# Patient Record
Sex: Male | Born: 1975 | Race: White | Hispanic: No | Marital: Single | Smoking: Never smoker
Health system: Southern US, Community
[De-identification: ages and names within clinical notes are randomized; demographics above are authoritative.]

## PROBLEM LIST (undated history)

## (undated) DIAGNOSIS — I10 Essential (primary) hypertension: Secondary | ICD-10-CM

## (undated) HISTORY — PX: APPENDECTOMY: SHX54

---

## 2003-11-04 ENCOUNTER — Encounter: Admission: RE | Admit: 2003-11-04 | Discharge: 2003-11-04 | Payer: Self-pay | Admitting: Occupational Medicine

## 2009-06-08 ENCOUNTER — Ambulatory Visit: Payer: Self-pay | Admitting: General Practice

## 2017-08-17 ENCOUNTER — Other Ambulatory Visit: Payer: Self-pay

## 2017-08-17 ENCOUNTER — Encounter (HOSPITAL_COMMUNITY): Payer: Self-pay | Admitting: Emergency Medicine

## 2017-08-17 ENCOUNTER — Emergency Department (HOSPITAL_COMMUNITY): Payer: Managed Care, Other (non HMO)

## 2017-08-17 ENCOUNTER — Emergency Department (HOSPITAL_COMMUNITY)
Admission: EM | Admit: 2017-08-17 | Discharge: 2017-08-17 | Disposition: A | Discharge status: Home / Self Care | Payer: Managed Care, Other (non HMO) | Attending: Emergency Medicine | Admitting: Emergency Medicine

## 2017-08-17 DIAGNOSIS — M26622 Arthralgia of left temporomandibular joint: Secondary | ICD-10-CM | POA: Insufficient documentation

## 2017-08-17 DIAGNOSIS — R519 Headache, unspecified: Secondary | ICD-10-CM

## 2017-08-17 DIAGNOSIS — R51 Headache: Secondary | ICD-10-CM | POA: Diagnosis not present

## 2017-08-17 MED ORDER — SODIUM CHLORIDE 0.9 % IV BOLUS
1000.0000 mL | Freq: Once | INTRAVENOUS | Status: AC
  Administered 2017-08-17: 1000 mL via INTRAVENOUS

## 2017-08-17 MED ORDER — METOCLOPRAMIDE HCL 5 MG/ML IJ SOLN
10.0000 mg | Freq: Once | INTRAMUSCULAR | Status: AC
  Administered 2017-08-17: 10 mg via INTRAVENOUS
  Filled 2017-08-17: qty 2

## 2017-08-17 MED ORDER — KETOROLAC TROMETHAMINE 30 MG/ML IJ SOLN
30.0000 mg | Freq: Once | INTRAMUSCULAR | Status: AC
  Administered 2017-08-17: 30 mg via INTRAVENOUS
  Filled 2017-08-17: qty 1

## 2017-08-17 MED ORDER — DIPHENHYDRAMINE HCL 50 MG/ML IJ SOLN
25.0000 mg | Freq: Once | INTRAMUSCULAR | Status: AC
  Administered 2017-08-17: 25 mg via INTRAVENOUS
  Filled 2017-08-17: qty 1

## 2017-08-17 NOTE — ED Notes (Signed)
Tele neuro cart placed in pt's room

## 2017-08-17 NOTE — Discharge Instructions (Addendum)
Your evaluated in the emergency department for acute left-sided facial pain.  You had a CAT scan and a neurology consult and no serious causes of your symptoms were identified.  This seems to be most related to your temporomandibular joint.  You should continue to take ibuprofen for inflammation and use a soft diet.  We are giving you the number for an ear nose throat specialist to follow-up with.  Please return if any worsening or new symptoms.

## 2017-08-17 NOTE — ED Triage Notes (Signed)
Patient c/o intermittent severe pain to left side of face and head (behind left eye) that started suddenly around 12pm while trying to take a nap. Patient states sharp shooting pain that is intermittent. Patient states pain makes it difficult for him to close mouth completely. Denies any facial drooping or slurred speech.

## 2017-08-17 NOTE — ED Provider Notes (Signed)
[facility] EMERGENCY DEPARTMENT Provider Note   CSN: [id number] Arrival date & time: 08/17/17  1823     History   Chief Complaint Chief Complaint  Patient presents with  . Facial Pain    HPI Casey Boyer is a 42 y.o. male.  42 year old male with acute onset of left periorbital and left TMJ sharp stabbing pain that occurred around noon today.  Says the pain is intermittent is increased with trying to open his mouth fully.  The pain can be binding and lasts a few seconds.  He is never had this before.  He denies any trauma.  No fevers no chills.  When he gets the pain he says his vision is blurry in that side but otherwise no visual symptoms.  Does not associate with any nasal congestion rhinorrhea cough shortness of breath.  He states he had a few drinks last night and felt a little hung over this morning but is never cause this kind of symptoms before.  No family  history of any trigeminal neuralgia or aneurysms.  He says the pain is making him nauseous.  The history is provided by the patient.  Headache   This is a new problem. The current episode started 6 to 12 hours ago. The problem occurs every few minutes. The problem has not changed since onset.The headache is associated with nothing. The pain is located in the temporal region. The quality of the pain is described as sharp. The pain is severe. The pain radiates to the face. Associated symptoms include nausea. Pertinent negatives include no fever, no chest pressure, no near-syncope, no palpitations, no syncope, no shortness of breath and no vomiting. He has tried NSAIDs for the symptoms. The treatment provided no relief.    History reviewed. No pertinent past medical history.  There are no active problems to display for this patient.   Past Surgical History:  Procedure Laterality Date  . APPENDECTOMY          Home Medications    Prior to Admission medications   Not on File    Family History Family History  Problem  Relation Age of Onset  . Cancer Mother     Social History Social History   Tobacco Use  . Smoking status: Never Smoker  . Smokeless tobacco: Never Used  Substance Use Topics  . Alcohol use: Yes    Comment: occasional  . Drug use: Never     Allergies   Patient has no known allergies.   Review of Systems Review of Systems  Constitutional: Negative for fever.  HENT: Negative for sore throat.   Eyes: Positive for pain.  Respiratory: Negative for shortness of breath.   Cardiovascular: Negative for chest pain, palpitations, syncope and near-syncope.  Gastrointestinal: Positive for nausea. Negative for abdominal pain and vomiting.  Genitourinary: Negative for dysuria.  Musculoskeletal: Negative for back pain and neck pain.  Skin: Negative for rash.  Neurological: Positive for headaches. Negative for facial asymmetry, speech difficulty, weakness and numbness.     Physical Exam Updated Vital Signs BP (!) 186/111 (BP Location: Right Arm)   Pulse 64   Temp 97.8 F (36.6 C) (Oral)   Resp 16   Ht 6' (1.829 m)   Wt 104.3 kg (230 lb)   SpO2 100%   BMI 31.19 kg/m   Physical Exam  Constitutional: He appears well-developed and well-nourished.  HENT:  Head: Normocephalic and atraumatic.  Right Ear: Tympanic membrane and external ear normal.  Left Ear: Tympanic membrane  and external ear normal.  Nose: Nose normal.  Mouth/Throat: Uvula is midline, oropharynx is clear and moist and mucous membranes are normal. No trismus in the jaw. Normal dentition.  He has some tenderness over his left TMJ.  There is no overlying erythema or fluctuance.  Eyes: Conjunctivae are normal.  Neck: Neck supple.  Cardiovascular: Normal rate and regular rhythm.  No murmur heard. Pulmonary/Chest: Effort normal and breath sounds normal. No respiratory distress.  Abdominal: Soft. There is no tenderness.  Musculoskeletal: He exhibits no edema.  Neurological: He is alert.  Skin: Skin is warm and dry.    Psychiatric: He has a normal mood and affect.  Nursing note and vitals reviewed.    ED Treatments / Results  Labs (all labs ordered are listed, but only abnormal results are displayed) Labs Reviewed - No data to display  EKG None  Radiology Ct Head Wo Contrast  Result Date: 08/17/2017 CLINICAL DATA:  Intermittent left headache/facial pain EXAM: CT HEAD WITHOUT CONTRAST CT MAXILLOFACIAL WITHOUT CONTRAST TECHNIQUE: Multidetector CT imaging of the head and maxillofacial structures were performed using the standard protocol without intravenous contrast. Multiplanar CT image reconstructions of the maxillofacial structures were also generated. COMPARISON:  None. FINDINGS: CT HEAD FINDINGS Brain: No evidence of acute infarction, hemorrhage, hydrocephalus, extra-axial collection or mass lesion/mass effect. Vascular: No hyperdense vessel or unexpected calcification. Skull: Normal. Negative for fracture or focal lesion. Other: None. CT MAXILLOFACIAL FINDINGS Osseous: No evidence of maxillofacial fracture. The mandible is intact. The bilateral mandibular condyles are well-seated in the TMJs. Orbits: The bilateral orbits, including the globes and retroconal soft tissues, are within normal limits. Sinuses: The visualized paranasal sinuses are essentially clear. The mastoid air cells are unopacified. Soft tissues: Multiple small bilateral cervical lymph nodes which are not pathologically enlarged. IMPRESSION: Normal head CT. Negative maxillofacial CT. Electronically Signed   By: Charline Bills M.D.   On: 08/17/2017 20:37   Ct Maxillofacial Wo Cm  Result Date: 08/17/2017 CLINICAL DATA:  Intermittent left headache/facial pain EXAM: CT HEAD WITHOUT CONTRAST CT MAXILLOFACIAL WITHOUT CONTRAST TECHNIQUE: Multidetector CT imaging of the head and maxillofacial structures were performed using the standard protocol without intravenous contrast. Multiplanar CT image reconstructions of the maxillofacial structures were  also generated. COMPARISON:  None. FINDINGS: CT HEAD FINDINGS Brain: No evidence of acute infarction, hemorrhage, hydrocephalus, extra-axial collection or mass lesion/mass effect. Vascular: No hyperdense vessel or unexpected calcification. Skull: Normal. Negative for fracture or focal lesion. Other: None. CT MAXILLOFACIAL FINDINGS Osseous: No evidence of maxillofacial fracture. The mandible is intact. The bilateral mandibular condyles are well-seated in the TMJs. Orbits: The bilateral orbits, including the globes and retroconal soft tissues, are within normal limits. Sinuses: The visualized paranasal sinuses are essentially clear. The mastoid air cells are unopacified. Soft tissues: Multiple small bilateral cervical lymph nodes which are not pathologically enlarged. IMPRESSION: Normal head CT. Negative maxillofacial CT. Electronically Signed   By: Charline Bills M.D.   On: 08/17/2017 20:37    Procedures Procedures (including critical care time)  Medications Ordered in ED Medications  sodium chloride 0.9 % bolus 1,000 mL (has no administration in time range)  metoCLOPramide (REGLAN) injection 10 mg (has no administration in time range)  diphenhydrAMINE (BENADRYL) injection 25 mg (has no administration in time range)     Initial Impression / Assessment and Plan / ED Course  I have reviewed the triage vital signs and the nursing notes.  Pertinent labs & imaging results that were available during my care of the  patient were reviewed by me and considered in my medical decision making (see chart for details).  Clinical Course as of Aug 18 1100  Wynelle LinkSun Aug 17, 2017  2107 Discussed with tele-neurology who recommends proceeding with a formal tele-neurology evaluation.  She did ask if I did add on a sed rate and CRP.   [MB]  2232 Tele-neurology has evaluated the patient and they do not feel this is neurologic.  She felt this was more consistent with some TMJ disease.  I gave the patient some Toradol and  he says he has had moderate relief with that.  He is comfortable going home and I will give him ENT number for follow-up.   [MB]    Clinical Course User Index [MB] Terrilee FilesButler, Tonnia Bardin C, MD     Final Clinical Impressions(s) / ED Diagnoses   Final diagnoses:  Facial pain, acute  TMJ tenderness, left    ED Discharge Orders    None       Terrilee FilesButler, Kyilee Gregg C, MD 08/18/17 1102

## 2017-08-17 NOTE — Consult Note (Signed)
    [facility] Tele[facility] Consult Services  Date of service:  08/17/17    Impression:   Left TMJ pain.  This is not of neurologic nature/origin; sxs/exam are not suggestive of migraine, trigeminal neuralgia, temporal arteritis, carotid artery dissection.   Recommendations:  defer to ED to guide further eval/tx for this pain, as this is outside scope of neurology.  Please contact [facility] Nav[facility] to reach me if further questions/concerns arise.  ______________________________________________________________   History of Present Illness: [age]year old man with no signif past med hx presents with left TMJ region for one hour then spread to left tempofrontal region, described as "intense stabbing" worsened with clenching teeth.  There was never any anterior left neck pain or recent neck manipulation/injury.  He notes lessened intensity now but it has not resolved yet.  He noted left eye became blurry only when pain was at it greatest intensity, lasting 1-2 minutes every 15-2520minutes with last occurence one hour ago.  He denies visual field loss, double vision, speech change, focal weakness/numbness, vertigo.  He denies associated nausea, vomiting, photophobia, sonophobia.  STAT consult requested by ED with ? of whether this is trigeminal neuralgia. Exam:  Neurological exam:  awake, alert, pleasant, no acute distress  tenderness to palpation > > left TMJ region worsened with opening/closing mouth, less so left temporal region/orbit  pupils equal round and reactive to light visual field full light touch symmetric bilateral face face strong/symmetric  moves all four ext 3/5 without drift  normal light touch all four ext  normal finger to nose  Imaging:  CT head/maxillofacial:  negative   Medical Data Reviewed:  1.Data reviewed include clinical labs, radiology,  Medical Tests;   2.Tests results discussed with performing or interpreting physician;   3. Reviewed  available prior medical records;  4. Obtained case history from another source if applicable/needed;  5. Independent review of image, tracing.   Medical Decision Making:  - Extensive number of diagnosis or management options are considered above.   - Extensive amount of complex data reviewed.   - High risk of complication and/or morbidity or mortality are associated with differential diagnostic considerations above.  - There may be uncertain outcome and increased probability of prolonged functional impairment or high probability of severe prolonged functional impairment associated with some of these differential diagnosis.    Patient was informed the Neurology Consult would happen via Tele[facility] consult by way of interactive audio and video telecommunications and consented to receiving care in this manner.

## 2017-08-17 NOTE — ED Notes (Signed)
Neurologist is on the camera at bedside at this time.

## 2018-02-18 DIAGNOSIS — I10 Essential (primary) hypertension: Secondary | ICD-10-CM | POA: Diagnosis not present

## 2018-05-18 DIAGNOSIS — B356 Tinea cruris: Secondary | ICD-10-CM | POA: Diagnosis not present

## 2018-05-18 DIAGNOSIS — L237 Allergic contact dermatitis due to plants, except food: Secondary | ICD-10-CM | POA: Diagnosis not present

## 2018-06-17 DIAGNOSIS — I10 Essential (primary) hypertension: Secondary | ICD-10-CM | POA: Diagnosis not present

## 2018-07-08 DIAGNOSIS — K219 Gastro-esophageal reflux disease without esophagitis: Secondary | ICD-10-CM | POA: Diagnosis not present

## 2018-07-08 DIAGNOSIS — Z7189 Other specified counseling: Secondary | ICD-10-CM | POA: Diagnosis not present

## 2018-07-08 DIAGNOSIS — Z0000 Encounter for general adult medical examination without abnormal findings: Secondary | ICD-10-CM | POA: Diagnosis not present

## 2018-07-08 DIAGNOSIS — I10 Essential (primary) hypertension: Secondary | ICD-10-CM | POA: Diagnosis not present

## 2018-07-10 DIAGNOSIS — Z0000 Encounter for general adult medical examination without abnormal findings: Secondary | ICD-10-CM | POA: Diagnosis not present

## 2019-07-05 DIAGNOSIS — Z0000 Encounter for general adult medical examination without abnormal findings: Secondary | ICD-10-CM | POA: Diagnosis not present

## 2019-07-12 ENCOUNTER — Ambulatory Visit: Payer: Managed Care, Other (non HMO) | Admitting: Dermatology

## 2019-08-11 DIAGNOSIS — E785 Hyperlipidemia, unspecified: Secondary | ICD-10-CM | POA: Diagnosis not present

## 2019-08-11 DIAGNOSIS — Z23 Encounter for immunization: Secondary | ICD-10-CM | POA: Diagnosis not present

## 2019-08-11 DIAGNOSIS — I10 Essential (primary) hypertension: Secondary | ICD-10-CM | POA: Diagnosis not present

## 2019-08-11 DIAGNOSIS — Z0000 Encounter for general adult medical examination without abnormal findings: Secondary | ICD-10-CM | POA: Diagnosis not present

## 2019-08-11 DIAGNOSIS — K219 Gastro-esophageal reflux disease without esophagitis: Secondary | ICD-10-CM | POA: Diagnosis not present

## 2019-11-08 DIAGNOSIS — E785 Hyperlipidemia, unspecified: Secondary | ICD-10-CM | POA: Diagnosis not present

## 2019-11-11 DIAGNOSIS — R7309 Other abnormal glucose: Secondary | ICD-10-CM | POA: Diagnosis not present

## 2019-11-11 DIAGNOSIS — R7989 Other specified abnormal findings of blood chemistry: Secondary | ICD-10-CM | POA: Diagnosis not present

## 2019-11-11 DIAGNOSIS — Z125 Encounter for screening for malignant neoplasm of prostate: Secondary | ICD-10-CM | POA: Diagnosis not present

## 2019-11-11 DIAGNOSIS — E785 Hyperlipidemia, unspecified: Secondary | ICD-10-CM | POA: Diagnosis not present

## 2020-02-08 IMAGING — CT CT HEAD W/O CM
3 of 6 series · 16 of 47 positions shown, 19 images · non-contrast
Comparison: None.

CLINICAL DATA: Intermittent left headache/facial pain

EXAM:
CT HEAD WITHOUT CONTRAST
CT MAXILLOFACIAL WITHOUT CONTRAST
TECHNIQUE: Multidetector CT imaging of the head and maxillofacial structures
were performed using the standard protocol without intravenous
contrast. Multiplanar CT image reconstructions of the maxillofacial
structures were also generated.

[Series 4: coronal soft tissue · coronal · 0.34mm/px · 3 of 81 slices shown]
[im 21/81  brain]
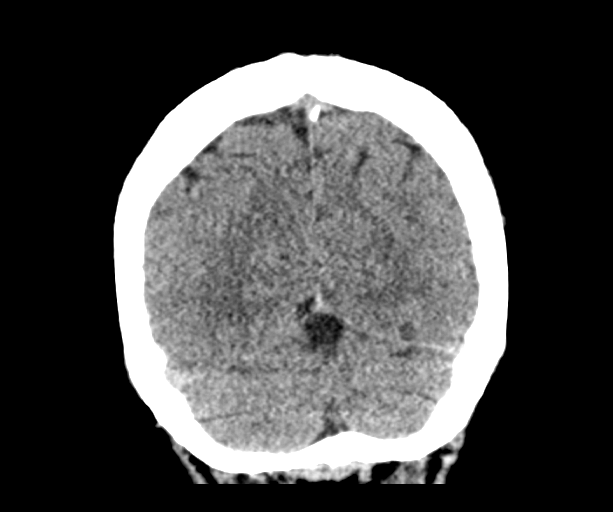
[im 41/81  brain]
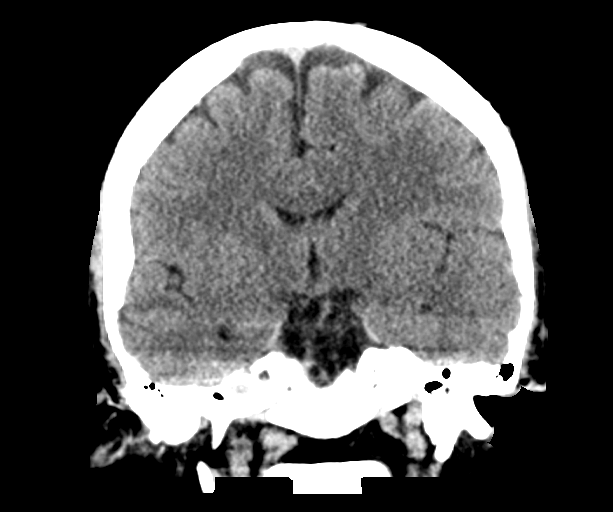
[im 61/81  brain]
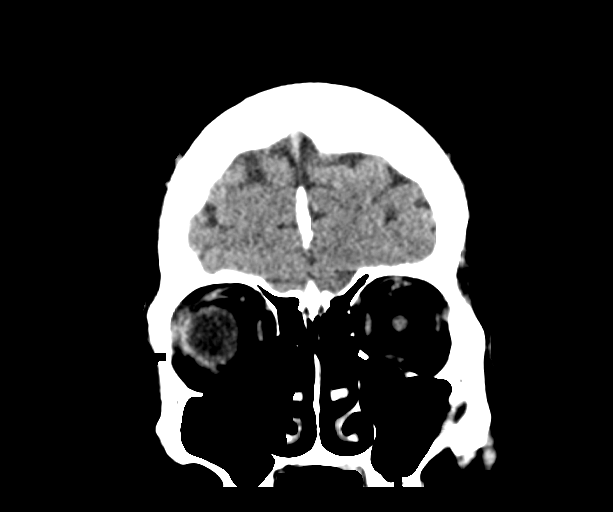

[Series 6: max soft · axial · 0.37mm/px · z∈[+1250,+1438]mm · 11 of 104 slices shown, 14 images]
[im 5/104  brain]
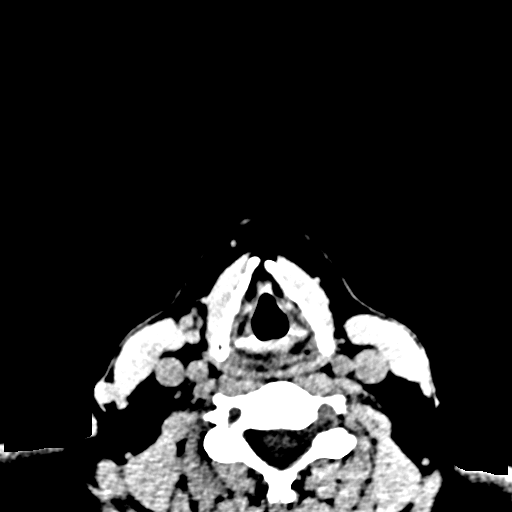
[im 5/104  bone]
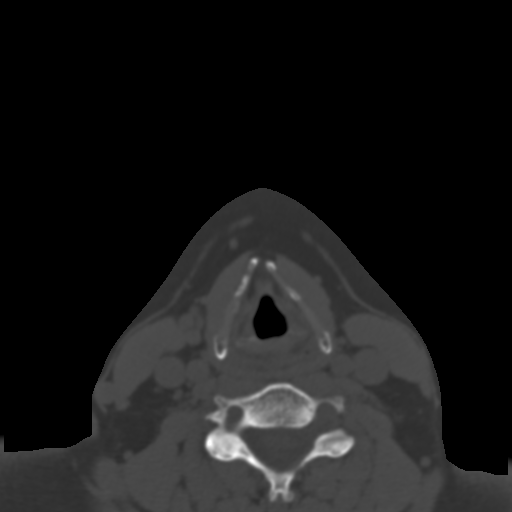
[im 15/104  brain]
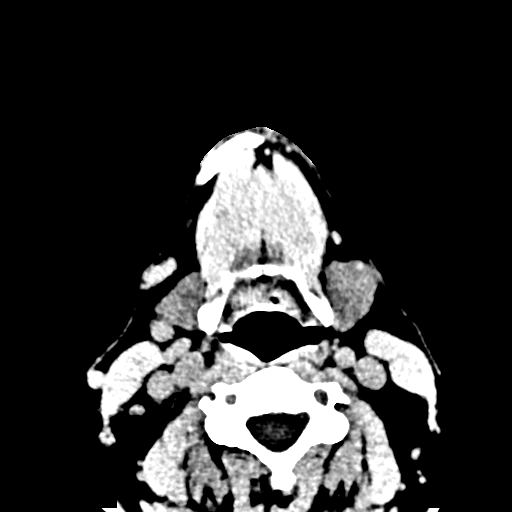
[im 24/104  brain]
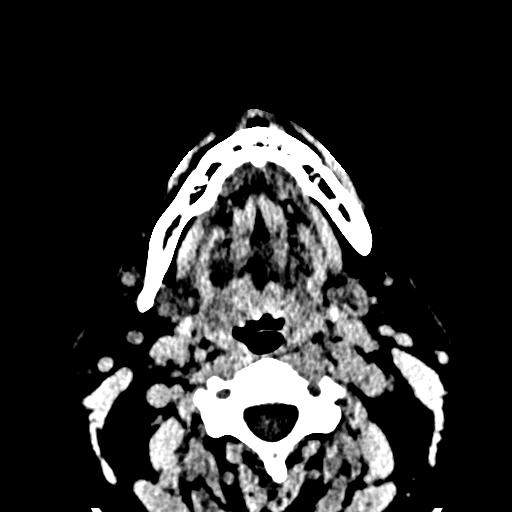
[im 33/104  brain]
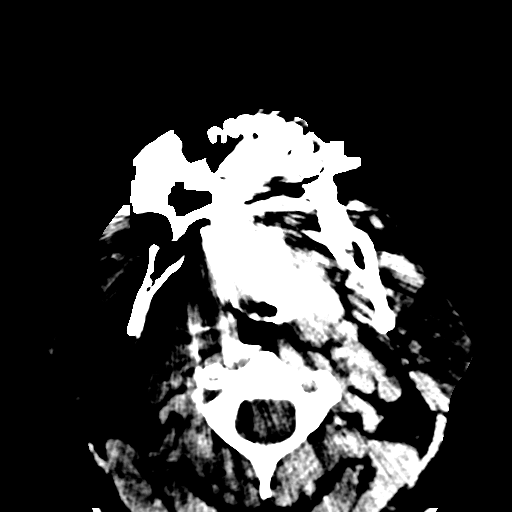
[im 43/104  brain]
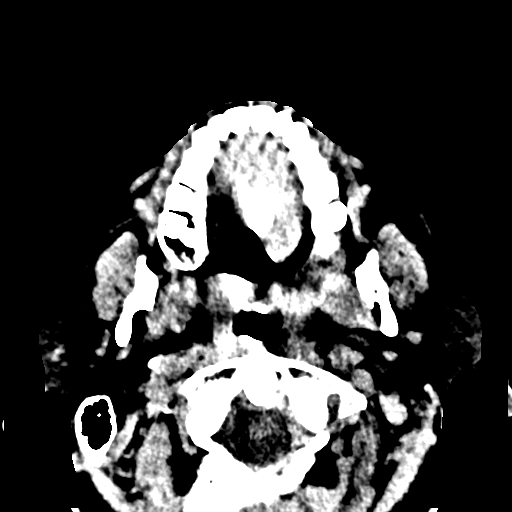
[im 43/104  bone]
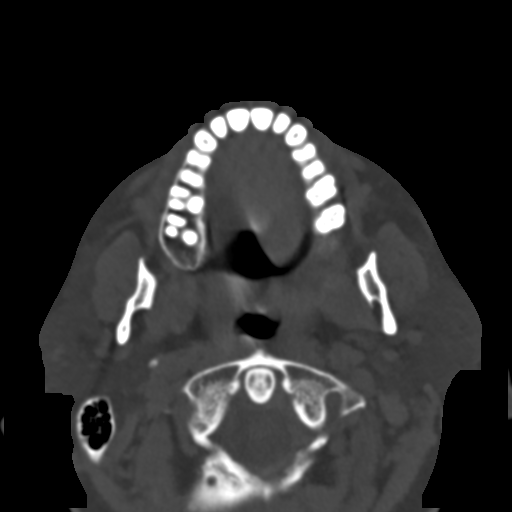
[im 52/104  brain]
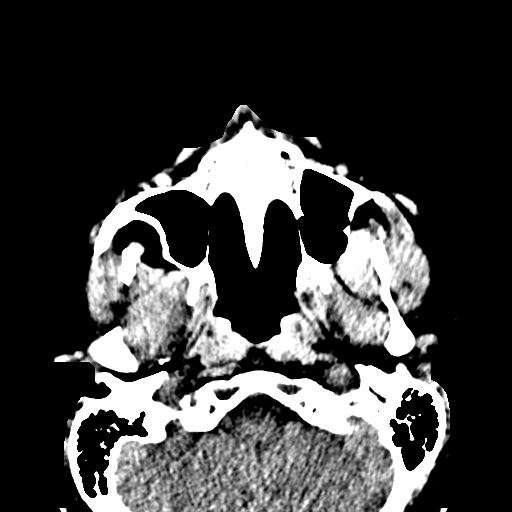
[im 61/104  brain]
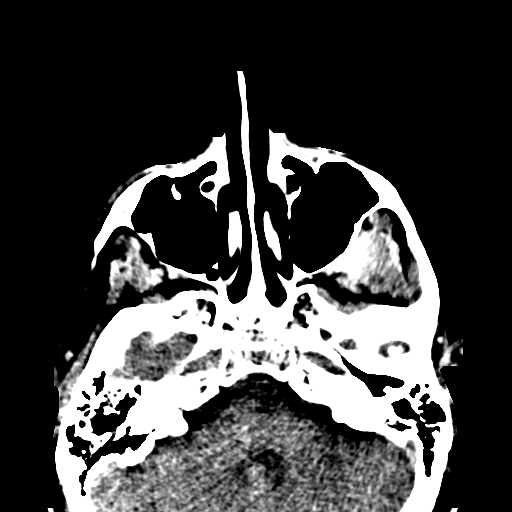
[im 71/104  brain]
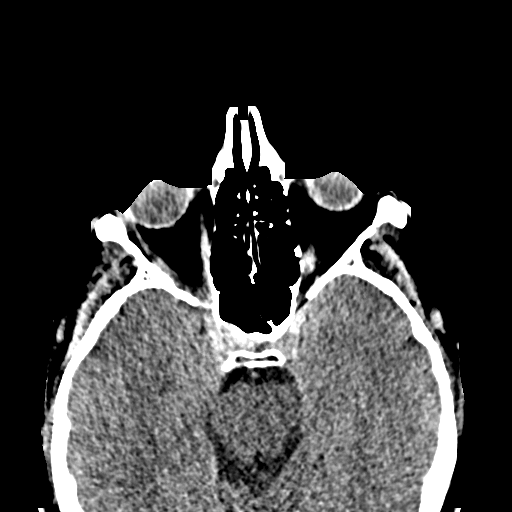
[im 80/104  brain]
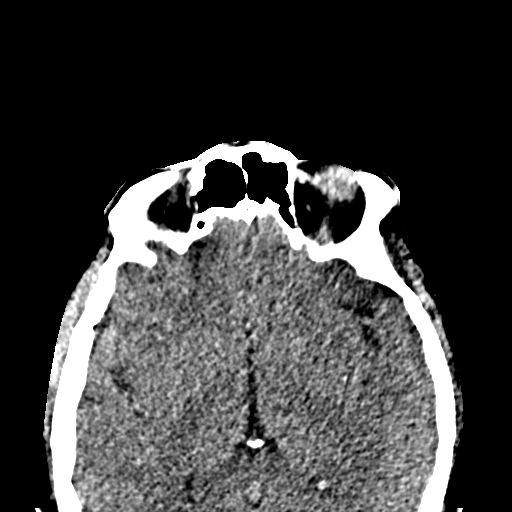
[im 80/104  bone]
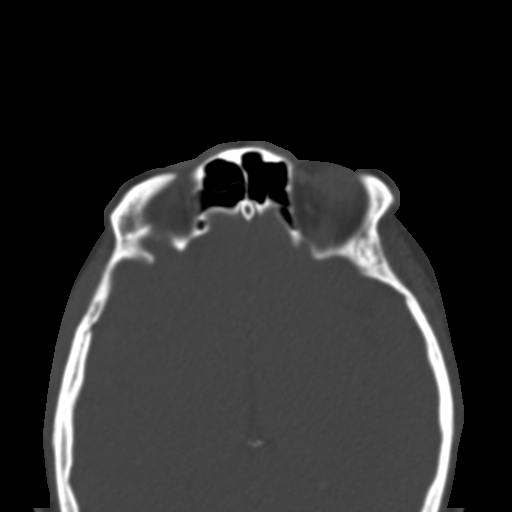
[im 89/104  brain]
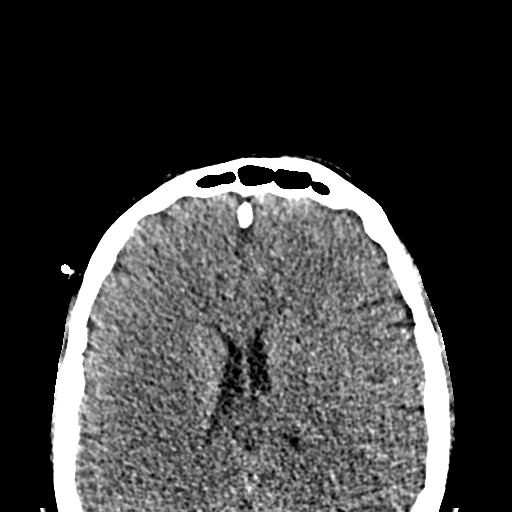
[im 99/104  brain]
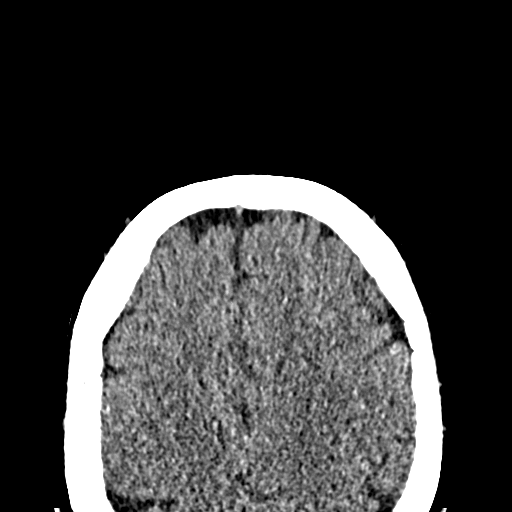

[Series 11: sagittal soft · sagittal · 0.40mm/px · 2 of 103 slices shown]
[im 35/103  brain]
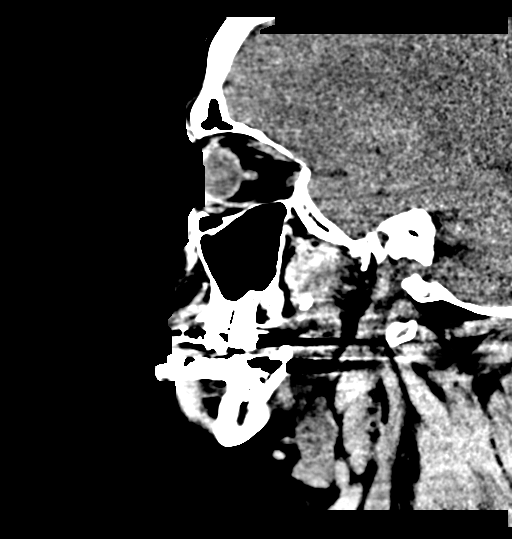
[im 69/103  brain]
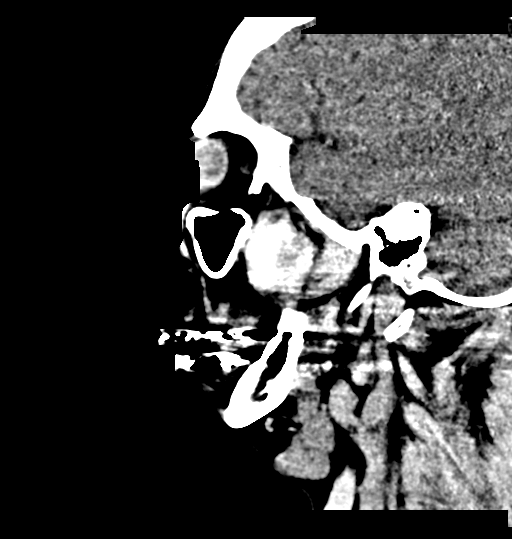

[16 of 47 positions shown; findings below may reference images not displayed]

FINDINGS: CT HEAD FINDINGS

Brain: No evidence of acute infarction, hemorrhage, hydrocephalus,
extra-axial collection or mass lesion/mass effect.

Vascular: No hyperdense vessel or unexpected calcification.

Skull: Normal. Negative for fracture or focal lesion.

Other: None.

CT MAXILLOFACIAL FINDINGS

Osseous: No evidence of maxillofacial fracture.

The mandible is intact. The bilateral mandibular condyles are
well-seated in the TMJs.

Orbits: The bilateral orbits, including the globes and retroconal
soft tissues, are within normal limits.

Sinuses: The visualized paranasal sinuses are essentially clear. The
mastoid air cells are unopacified.

Soft tissues: Multiple small bilateral cervical lymph nodes which
are not pathologically enlarged.
IMPRESSION: Normal head CT.

Negative maxillofacial CT.

## 2020-06-05 DIAGNOSIS — H00014 Hordeolum externum left upper eyelid: Secondary | ICD-10-CM | POA: Diagnosis not present

## 2020-08-01 DIAGNOSIS — E785 Hyperlipidemia, unspecified: Secondary | ICD-10-CM | POA: Diagnosis not present

## 2020-08-01 DIAGNOSIS — I10 Essential (primary) hypertension: Secondary | ICD-10-CM | POA: Diagnosis not present

## 2020-08-01 DIAGNOSIS — Z125 Encounter for screening for malignant neoplasm of prostate: Secondary | ICD-10-CM | POA: Diagnosis not present

## 2020-08-01 DIAGNOSIS — R7989 Other specified abnormal findings of blood chemistry: Secondary | ICD-10-CM | POA: Diagnosis not present

## 2020-08-11 DIAGNOSIS — E785 Hyperlipidemia, unspecified: Secondary | ICD-10-CM | POA: Diagnosis not present

## 2020-08-11 DIAGNOSIS — Z0000 Encounter for general adult medical examination without abnormal findings: Secondary | ICD-10-CM | POA: Diagnosis not present

## 2020-08-11 DIAGNOSIS — K219 Gastro-esophageal reflux disease without esophagitis: Secondary | ICD-10-CM | POA: Diagnosis not present

## 2020-08-11 DIAGNOSIS — I10 Essential (primary) hypertension: Secondary | ICD-10-CM | POA: Diagnosis not present

## 2021-02-06 DIAGNOSIS — E785 Hyperlipidemia, unspecified: Secondary | ICD-10-CM | POA: Diagnosis not present

## 2021-02-06 DIAGNOSIS — I10 Essential (primary) hypertension: Secondary | ICD-10-CM | POA: Diagnosis not present

## 2021-02-06 DIAGNOSIS — R7309 Other abnormal glucose: Secondary | ICD-10-CM | POA: Diagnosis not present

## 2021-02-13 DIAGNOSIS — R748 Abnormal levels of other serum enzymes: Secondary | ICD-10-CM | POA: Diagnosis not present

## 2021-02-13 DIAGNOSIS — E785 Hyperlipidemia, unspecified: Secondary | ICD-10-CM | POA: Diagnosis not present

## 2021-02-13 DIAGNOSIS — I10 Essential (primary) hypertension: Secondary | ICD-10-CM | POA: Diagnosis not present

## 2021-02-13 DIAGNOSIS — K219 Gastro-esophageal reflux disease without esophagitis: Secondary | ICD-10-CM | POA: Diagnosis not present

## 2021-08-14 DIAGNOSIS — I10 Essential (primary) hypertension: Secondary | ICD-10-CM | POA: Diagnosis not present

## 2021-08-14 DIAGNOSIS — R7989 Other specified abnormal findings of blood chemistry: Secondary | ICD-10-CM | POA: Diagnosis not present

## 2021-08-14 DIAGNOSIS — E785 Hyperlipidemia, unspecified: Secondary | ICD-10-CM | POA: Diagnosis not present

## 2021-08-20 DIAGNOSIS — I10 Essential (primary) hypertension: Secondary | ICD-10-CM | POA: Diagnosis not present

## 2021-08-20 DIAGNOSIS — Z0000 Encounter for general adult medical examination without abnormal findings: Secondary | ICD-10-CM | POA: Diagnosis not present

## 2021-08-20 DIAGNOSIS — E785 Hyperlipidemia, unspecified: Secondary | ICD-10-CM | POA: Diagnosis not present

## 2021-08-20 DIAGNOSIS — K219 Gastro-esophageal reflux disease without esophagitis: Secondary | ICD-10-CM | POA: Diagnosis not present

## 2022-02-22 DIAGNOSIS — R7309 Other abnormal glucose: Secondary | ICD-10-CM | POA: Diagnosis not present

## 2022-02-22 DIAGNOSIS — R7989 Other specified abnormal findings of blood chemistry: Secondary | ICD-10-CM | POA: Diagnosis not present

## 2022-02-22 DIAGNOSIS — Z125 Encounter for screening for malignant neoplasm of prostate: Secondary | ICD-10-CM | POA: Diagnosis not present

## 2022-02-22 DIAGNOSIS — I10 Essential (primary) hypertension: Secondary | ICD-10-CM | POA: Diagnosis not present

## 2022-02-22 DIAGNOSIS — E785 Hyperlipidemia, unspecified: Secondary | ICD-10-CM | POA: Diagnosis not present

## 2022-02-22 DIAGNOSIS — K219 Gastro-esophageal reflux disease without esophagitis: Secondary | ICD-10-CM | POA: Diagnosis not present

## 2022-03-04 ENCOUNTER — Other Ambulatory Visit (HOSPITAL_COMMUNITY): Payer: Self-pay | Admitting: Family Medicine

## 2022-03-04 DIAGNOSIS — Z0000 Encounter for general adult medical examination without abnormal findings: Secondary | ICD-10-CM | POA: Diagnosis not present

## 2022-03-04 DIAGNOSIS — E785 Hyperlipidemia, unspecified: Secondary | ICD-10-CM

## 2022-03-04 DIAGNOSIS — L309 Dermatitis, unspecified: Secondary | ICD-10-CM | POA: Diagnosis not present

## 2022-03-04 DIAGNOSIS — R748 Abnormal levels of other serum enzymes: Secondary | ICD-10-CM | POA: Diagnosis not present

## 2022-03-06 DIAGNOSIS — R748 Abnormal levels of other serum enzymes: Secondary | ICD-10-CM | POA: Diagnosis not present

## 2022-03-06 DIAGNOSIS — K8020 Calculus of gallbladder without cholecystitis without obstruction: Secondary | ICD-10-CM | POA: Diagnosis not present

## 2022-03-06 DIAGNOSIS — K760 Fatty (change of) liver, not elsewhere classified: Secondary | ICD-10-CM | POA: Diagnosis not present

## 2022-03-18 DIAGNOSIS — K219 Gastro-esophageal reflux disease without esophagitis: Secondary | ICD-10-CM | POA: Diagnosis not present

## 2022-03-18 DIAGNOSIS — Z1211 Encounter for screening for malignant neoplasm of colon: Secondary | ICD-10-CM | POA: Diagnosis not present

## 2022-03-18 DIAGNOSIS — I10 Essential (primary) hypertension: Secondary | ICD-10-CM | POA: Diagnosis not present

## 2022-03-19 ENCOUNTER — Ambulatory Visit (HOSPITAL_COMMUNITY)
Admission: RE | Admit: 2022-03-19 | Discharge: 2022-03-19 | Disposition: A | Discharge status: Home / Self Care | Payer: Managed Care, Other (non HMO) | Source: Ambulatory Visit | Attending: Family Medicine | Admitting: Family Medicine

## 2022-03-19 DIAGNOSIS — E785 Hyperlipidemia, unspecified: Secondary | ICD-10-CM

## 2022-04-16 DIAGNOSIS — D124 Benign neoplasm of descending colon: Secondary | ICD-10-CM | POA: Diagnosis not present

## 2022-04-16 DIAGNOSIS — Z1211 Encounter for screening for malignant neoplasm of colon: Secondary | ICD-10-CM | POA: Diagnosis not present

## 2022-04-16 DIAGNOSIS — K635 Polyp of colon: Secondary | ICD-10-CM | POA: Diagnosis not present

## 2022-12-08 ENCOUNTER — Ambulatory Visit
Admission: EM | Admit: 2022-12-08 | Discharge: 2022-12-08 | Disposition: A | Discharge status: Home / Self Care | Payer: BC Managed Care – PPO

## 2022-12-08 DIAGNOSIS — R42 Dizziness and giddiness: Secondary | ICD-10-CM

## 2022-12-08 HISTORY — DX: Essential (primary) hypertension: I10

## 2022-12-08 MED ORDER — FLUTICASONE PROPIONATE 50 MCG/ACT NA SUSP: 2.0000 | Freq: Every day | NASAL | 0 refills | Status: AC

## 2022-12-08 MED ORDER — PREDNISONE 20 MG PO TABS: 40.0000 mg | ORAL_TABLET | Freq: Every day | ORAL | 0 refills | Status: AC

## 2022-12-08 MED ORDER — ONDANSETRON 4 MG PO TBDP
4.0000 mg | ORAL_TABLET | Freq: Once | ORAL | Status: AC
  Administered 2022-12-08: 4 mg via ORAL

## 2022-12-08 MED ORDER — ONDANSETRON 4 MG PO TBDP: 4.0000 mg | ORAL_TABLET | Freq: Three times a day (TID) | ORAL | 0 refills | Status: DC | PRN

## 2022-12-08 MED ORDER — CETIRIZINE HCL 10 MG PO TABS: 10.0000 mg | ORAL_TABLET | Freq: Every day | ORAL | 0 refills | Status: DC

## 2022-12-08 NOTE — ED Triage Notes (Signed)
Pt reports he has been feeling dizzy and n/v since this morning     Took OTC meclizine and bp meds but vomited it up.

## 2022-12-08 NOTE — Discharge Instructions (Addendum)
Take medication as prescribed.  Please take your blood pressure medicine as soon as you get home. Increase fluids and allow for plenty of rest. Continue over-the-counter meclizine you are currently taking as needed.  This medication may cause drowsiness.  No driving, operate any heavy machinery, or drinking alcohol while taking the medication. Avoid sudden movement such as twisting, turning, or leaning forward. As discussed, if your symptoms do not improve with the treatment provided today, would like for you to follow-up with your primary care physician for further evaluation. Go to the emergency department immediately if you experience sudden change in vision, loss of vision, loss of consciousness, or fainting. Follow-up as needed.

## 2022-12-08 NOTE — ED Provider Notes (Signed)
[facility]    CSN: [id number] Arrival date & time: 12/08/22  1427      History   Chief Complaint Chief Complaint  Patient presents with   Dizziness    HPI Casey Boyer is a 47 y.o. male.   The history is provided by the patient.   Patient presents for complaints of dizziness and nausea that started this morning upon wakening.  Patient states that he feels like "he is spinning."  He also complains of nausea.  Patient states that he did pick up over-the-counter meclizine, but vomited up the meclizine and his blood pressure medicine when he attempted to take it.  He denies headache, blurred vision, lower extremity weakness, slurred speech, or change in his mental status.  Patient denies prior history of vertigo, but states that he does have constant congestion.  Past Medical History:  Diagnosis Date   Hypertension     There are no problems to display for this patient.   Past Surgical History:  Procedure Laterality Date   APPENDECTOMY         Home Medications    Prior to Admission medications   Medication Sig Start Date End Date Taking? Authorizing Provider  cetirizine (ZYRTEC) 10 MG tablet Take 1 tablet (10 mg total) by mouth daily. 12/08/22  Yes Leath-Warren, Sadie Haber, NP  fluticasone (FLONASE) 50 MCG/ACT nasal spray Place 2 sprays into both nostrils daily. 12/08/22  Yes Leath-Warren, Sadie Haber, NP  Krill Oil 300 MG CAPS Take by mouth.   Yes [provider]  lisinopril (ZESTRIL) 10 MG tablet Take 10 mg by mouth daily.   Yes [provider]  omeprazole (PRILOSEC) 20 MG capsule Take 20 mg by mouth daily.   Yes [provider]  ondansetron (ZOFRAN-ODT) 4 MG disintegrating tablet Take 1 tablet (4 mg total) by mouth every 8 (eight) hours as needed. 12/08/22  Yes Leath-Warren, Sadie Haber, NP  predniSONE (DELTASONE) 20 MG tablet Take 2 tablets (40 mg total) by mouth daily with breakfast for 5 days. 12/08/22 12/13/22 Yes Leath-Warren,  Sadie Haber, NP    Family History History reviewed. No pertinent family history.  Social History Social History   Tobacco Use   Smoking status: Never   Smokeless tobacco: Never  Vaping Use   Vaping status: Never Used  Substance Use Topics   Alcohol use: Yes   Drug use: Never     Allergies   Patient has no known allergies.   Review of Systems Review of Systems Per HPI  Physical Exam Triage Vital Signs ED Triage Vitals  Encounter Vitals Group     BP 12/08/22 1510 (!) 189/96     Systolic BP Percentile --      Diastolic BP Percentile --      Pulse Rate 12/08/22 1510 67     Resp 12/08/22 1510 16     Temp 12/08/22 1510 97.9 F (36.6 C)     Temp Source 12/08/22 1510 Oral     SpO2 12/08/22 1510 97 %     Weight --      Height --      Head Circumference --      Peak Flow --      Pain Score 12/08/22 1512 4     Pain Loc --      Pain Education --      Exclude from Growth Chart --    Orthostatic VS for the past 24 hrs:  BP- Lying Pulse- Lying BP- Sitting Pulse-  Sitting BP- Standing at 0 minutes Pulse- Standing at 0 minutes  12/08/22 1521 (!) 158/98 76 (!) 167/102 83 (!) 167/109 75    Updated Vital Signs BP (!) 189/96 (BP Location: Right Arm)   Pulse 67   Temp 97.9 F (36.6 C) (Oral)   Resp 16   SpO2 97%   Visual Acuity Right Eye Distance:   Left Eye Distance:   Bilateral Distance:    Right Eye Near:   Left Eye Near:    Bilateral Near:     Physical Exam Vitals and nursing note reviewed.  Constitutional:      General: He is not in acute distress.    Appearance: Normal appearance.  HENT:     Head: Normocephalic.     Right Ear: Ear canal and external ear normal. A middle ear effusion is present.     Left Ear: Tympanic membrane, ear canal and external ear normal.     Mouth/Throat:     Mouth: Mucous membranes are moist.     Pharynx: No posterior oropharyngeal erythema.  Eyes:     Extraocular Movements: Extraocular movements intact.      Conjunctiva/sclera: Conjunctivae normal.     Pupils: Pupils are equal, round, and reactive to light.  Cardiovascular:     Rate and Rhythm: Regular rhythm.     Pulses: Normal pulses.     Heart sounds: Normal heart sounds.  Pulmonary:     Effort: Pulmonary effort is normal.     Breath sounds: Normal breath sounds.  Abdominal:     General: Bowel sounds are normal.     Palpations: Abdomen is soft.     Tenderness: There is no abdominal tenderness.  Musculoskeletal:     Cervical back: Normal range of motion.  Skin:    General: Skin is warm and dry.  Neurological:     General: No focal deficit present.     Mental Status: He is alert and oriented to person, place, and time.  Psychiatric:        Mood and Affect: Mood normal.        Behavior: Behavior normal.      UC Treatments / Results  Labs (all labs ordered are listed, but only abnormal results are displayed) Labs Reviewed - No data to display  EKG   Radiology No results found.  Procedures Procedures (including critical care time)  Medications Ordered in UC Medications  ondansetron (ZOFRAN-ODT) disintegrating tablet 4 mg (4 mg Oral Given 12/08/22 1526)    Initial Impression / Assessment and Plan / UC Course  I have reviewed the triage vital signs and the nursing notes.  Pertinent labs & imaging results that were available during my care of the patient were reviewed by me and considered in my medical decision making (see chart for details).  Orthostatic vital signs were negative.  Patient is currently hypertensive, reports that he did vomit his medicine after he took it due to the nausea.  Patient was given ondansetron 4 mg ODT, reports good relief of nausea.  He has been taking over-the-counter meclizine with also some relief of his symptoms.  Will start patient on fluticasone 50 mcg nasal spray and cetirizine 10 mg for eustachian tube dysfunction.  Patient was advised if symptoms do not improve with this treatment, a  prescription for prednisone 40 mg has also been prescribed.  Zofran 4 mg ODT has also been prescribed for patient's nausea.  Supportive care recommendations were provided and discussed with the patient to include  fluids, rest, and avoiding sudden movement.  Patient was given strict ER follow-up precautions, along with indications to follow-up with his PCP.  Patient is in agreement with this plan of care and verbalizes understanding.  All questions were answered.  Patient stable for discharge.  Final Clinical Impressions(s) / UC Diagnoses   Final diagnoses:  Vertigo     Discharge Instructions      Take medication as prescribed.  Please take your blood pressure medicine as soon as you get home. Increase fluids and allow for plenty of rest. Continue over-the-counter meclizine you are currently taking as needed.  This medication may cause drowsiness.  No driving, operate any heavy machinery, or drinking alcohol while taking the medication. Avoid sudden movement such as twisting, turning, or leaning forward. As discussed, if your symptoms do not improve with the treatment provided today, would like for you to follow-up with your primary care physician for further evaluation. Go to the emergency department immediately if you experience sudden change in vision, loss of vision, loss of consciousness, or fainting. Follow-up as needed.     ED Prescriptions     Medication Sig Dispense Auth. Provider   fluticasone (FLONASE) 50 MCG/ACT nasal spray Place 2 sprays into both nostrils daily. 16 g Leath-Warren, Sadie Haber, NP   cetirizine (ZYRTEC) 10 MG tablet Take 1 tablet (10 mg total) by mouth daily. 30 tablet Leath-Warren, Sadie Haber, NP   predniSONE (DELTASONE) 20 MG tablet Take 2 tablets (40 mg total) by mouth daily with breakfast for 5 days. 10 tablet Leath-Warren, Sadie Haber, NP   ondansetron (ZOFRAN-ODT) 4 MG disintegrating tablet Take 1 tablet (4 mg total) by mouth every 8 (eight) hours as  needed. 20 tablet Leath-Warren, Sadie Haber, NP      PDMP not reviewed this encounter.   Abran Cantor, NP 12/08/22 1540

## 2022-12-09 ENCOUNTER — Encounter (HOSPITAL_COMMUNITY): Payer: Self-pay | Admitting: Emergency Medicine

## 2023-11-17 ENCOUNTER — Encounter: Payer: Self-pay | Admitting: Dermatology

## 2023-11-17 ENCOUNTER — Ambulatory Visit: Admitting: Dermatology

## 2023-11-17 VITALS — BP 158/102

## 2023-11-17 DIAGNOSIS — L821 Other seborrheic keratosis: Secondary | ICD-10-CM | POA: Diagnosis not present

## 2023-11-17 DIAGNOSIS — D2239 Melanocytic nevi of other parts of face: Secondary | ICD-10-CM | POA: Diagnosis not present

## 2023-11-17 DIAGNOSIS — W908XXA Exposure to other nonionizing radiation, initial encounter: Secondary | ICD-10-CM | POA: Diagnosis not present

## 2023-11-17 DIAGNOSIS — D225 Melanocytic nevi of trunk: Secondary | ICD-10-CM

## 2023-11-17 DIAGNOSIS — D485 Neoplasm of uncertain behavior of skin: Secondary | ICD-10-CM

## 2023-11-17 DIAGNOSIS — L578 Other skin changes due to chronic exposure to nonionizing radiation: Secondary | ICD-10-CM | POA: Diagnosis not present

## 2023-11-17 DIAGNOSIS — D229 Melanocytic nevi, unspecified: Secondary | ICD-10-CM

## 2023-11-17 DIAGNOSIS — D2371 Other benign neoplasm of skin of right lower limb, including hip: Secondary | ICD-10-CM | POA: Diagnosis not present

## 2023-11-17 DIAGNOSIS — L814 Other melanin hyperpigmentation: Secondary | ICD-10-CM

## 2023-11-17 DIAGNOSIS — Z1283 Encounter for screening for malignant neoplasm of skin: Secondary | ICD-10-CM | POA: Diagnosis not present

## 2023-11-17 DIAGNOSIS — D1801 Hemangioma of skin and subcutaneous tissue: Secondary | ICD-10-CM

## 2023-11-17 NOTE — Progress Notes (Signed)
 New Patient Visit   Subjective  Casey Boyer is a 48 y.o. male NEW PATIENT who presents for the following:   Total Body Skin Exam (TBSE)  The patient reports he has spots (eyebrow), moles and lesions to be evaluated, some may be new or changing and the patient may have concern these could be cancer. Patient has previously been treated by a dermatologist at [facility]. Hx of Bx - benign. No family Hx of skin cancers. Patient does apply sunscreen and/or wears protective coverings.  The following portions of the chart were reviewed this encounter and updated as appropriate: medications, allergies, medical history  Review of Systems:  No other skin or systemic complaints except as noted in HPI or Assessment and Plan.  Objective  Well appearing patient in no apparent distress; mood and affect are within normal limits.  A full examination was performed including scalp, head, eyes, ears, nose, lips, neck, chest, axillae, abdomen, back, buttocks, bilateral upper extremities, bilateral lower extremities, hands, feet, fingers, toes, fingernails, and toenails. All findings within normal limits unless otherwise noted below.    Relevant exam findings are noted in the Assessment and Plan.       Right Abdomen (side) - Upper 5mm irregular macule   Assessment & Plan   LENTIGINES, SEBORRHEIC KERATOSES, HEMANGIOMAS - Benign normal skin lesions - Benign-appearing - Call for any changes  BENIGN MELANOCYTIC NEVI --8mm flesh colored papule on R eyebrow referring to plastics due to recurrence post-shave removal - Tan-brown and/or pink-flesh-colored symmetric macules and papules - Benign appearing on exam today - Observation - Call clinic for new or changing moles - Recommend daily use of broad spectrum spf 30+ sunscreen to sun-exposed areas.   MILD ACTINIC DAMAGE - Chronic condition, secondary to cumulative UV/sun exposure - diffuse scaly erythematous macules with underlying  dyspigmentation - Recommend daily broad spectrum sunscreen SPF 30+ to sun-exposed areas, reapply every 2 hours as needed.  - Staying in the shade or wearing long sleeves, sun glasses (UVA+UVB protection) and wide brim hats (4-inch brim around the entire circumference of the hat) are also recommended for sun protection.  - Call for new or changing lesions.  DERMATOFIBROMA Exam: Firm pink/brown papulenodule with dimple sign at R interior thigh  Treatment Plan: A dermatofibroma is a benign growth possibly related to trauma, such as an insect bite, cut from shaving, or inflamed acne-type bump.  Treatment options to remove include shave or excision with resulting scar and risk of recurrence.  Since benign-appearing and not bothersome, will observe for now.    SKIN CANCER SCREENING PERFORMED TODAY BENIGN MOLE   Related Procedures Ambulatory referral to Plastic Surgery NEOPLASM OF UNCERTAIN BEHAVIOR OF SKIN Right Abdomen (side) - Upper Skin / nail biopsy Type of biopsy: tangential   Informed consent: discussed and consent obtained   Timeout: patient name, date of birth, surgical site, and procedure verified   Procedure prep:  Patient was prepped and draped in usual sterile fashion Prep type:  Isopropyl alcohol Anesthesia: the lesion was anesthetized in a standard fashion   Anesthetic:  1% lidocaine w/ epinephrine 1-100,000 buffered w/ 8.4% NaHCO3 Instrument used: DermaBlade   Hemostasis achieved with: aluminum chloride   Outcome: patient tolerated procedure well   Post-procedure details: sterile dressing applied and wound care instructions given   Dressing type: petrolatum gauze and bandage     Return in about 1 year (around 11/16/2024) for TBSE.     Documentation: I have reviewed the above documentation for accuracy and  completeness, and I agree with the above.  I, Gadiel John Maranda, CMA, am acting as scribe for [employer], DO.   Delon Lenis, DO

## 2023-11-17 NOTE — Patient Instructions (Addendum)

## 2023-11-19 LAB — SURGICAL PATHOLOGY

## 2023-11-20 ENCOUNTER — Ambulatory Visit: Payer: Self-pay | Admitting: Dermatology

## 2023-11-20 DIAGNOSIS — D239 Other benign neoplasm of skin, unspecified: Secondary | ICD-10-CM | POA: Insufficient documentation

## 2023-11-20 NOTE — Progress Notes (Signed)
 Hi Casey Boyer, Please call Casey Boyer and notify that their bx results showed an abnormal mole that requires a full excision in office with Casey Boyer       1. 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

## 2023-11-27 ENCOUNTER — Ambulatory Visit

## 2023-11-27 VITALS — BP 159/102 | HR 72 | Ht 72.0 in | Wt 260.8 lb

## 2023-11-27 DIAGNOSIS — D2239 Melanocytic nevi of other parts of face: Secondary | ICD-10-CM

## 2023-11-27 DIAGNOSIS — D229 Melanocytic nevi, unspecified: Secondary | ICD-10-CM

## 2023-11-27 NOTE — Progress Notes (Signed)
 NAME: Casey Boyer  MRN: [id number]  DOB: March 24, 1975   Referring physician: Alm Delon SAILOR, DO  PCP: Gerome Brunet, DO   CHIEF COMPLAINT: Mole of the face above medial right eyebrow    HPI:  Casey Boyer is a 48 y.o. year old male who presents with a  right medial eyebrow mole that is non painful, soft and has enlarged over the last few months slowly. No episodes of infection noted. Non-smoker. Has been shaved and grew back, benign appearance per dermatology evaluation. Does not bother the patient.     PMH:  Past Medical History:  Diagnosis Date   Dysplastic nevus 11/20/2023   right abdomen (side) - upper - severe atypia- Ex needed    Hypertension     PSH:  Past Surgical History:  Procedure Laterality Date   APPENDECTOMY       MEDICATIONS:   Current Outpatient Medications:    cetirizine (ZYRTEC) 10 MG tablet, Take 1 tablet (10 mg total) by mouth daily., Disp: 30 tablet, Rfl: 0   fluticasone (FLONASE) 50 MCG/ACT nasal spray, Place 2 sprays into both nostrils daily., Disp: 16 g, Rfl: 0   ibuprofen (ADVIL,MOTRIN) 200 MG tablet, Take 400 mg by mouth every 6 (six) hours as needed., Disp: , Rfl:    omeprazole (PRILOSEC) 20 MG capsule, Take 20 mg by mouth daily., Disp: , Rfl:    spironolactone (ALDACTONE) 25 MG tablet, Take 25 mg by mouth daily., Disp: , Rfl:    telmisartan (MICARDIS) 80 MG tablet, Take 80 mg by mouth daily., Disp: , Rfl:    Krill Oil 300 MG CAPS, Take by mouth. (Patient not taking: Reported on 11/27/2023), Disp: , Rfl:    lisinopril (ZESTRIL) 10 MG tablet, Take 10 mg by mouth daily. (Patient not taking: Reported on 11/27/2023), Disp: , Rfl:    ondansetron  (ZOFRAN -ODT) 4 MG disintegrating tablet, Take 1 tablet (4 mg total) by mouth every 8 (eight) hours as needed. (Patient not taking: Reported on 11/27/2023), Disp: 20 tablet, Rfl: 0   oxymetazoline (AFRIN) 0.05 % nasal spray, Place 1 spray into both nostrils 2 (two) times daily as needed for congestion.  (Patient not taking: Reported on 11/27/2023), Disp: , Rfl:    ALLERGIES:  has no known allergies.   FAMILY HISTORY:  Family History  Problem Relation Age of Onset   Cancer Mother       VITALS:  Vitals:   11/27/23 1422  BP: (!) 159/102  Pulse: 72  SpO2: 97%    Constitutional: Good color, good hydration. VSS. Head and Neck: No lymphadenopathy, thyromegaly or masses  Chest: Normal breathing, Normal shape and excursion.  Face: Mole measures 1 cm x 1.2 cm above the medial right brow. Currently not infected.  Mobile and not attached to underlying structures.  No basin lymphadenopathy, satellite lesions or in-transit lesions.    ASSESSMENT/PLAN  Assessment & Plan   Pt. presents with a mole above the medial right brow.  Today we discussed the risks, benefits and alternatives to mass excision and possible tissue rearrangement. We discussed the alternatives which include continued observation; however, I told the patient that I do not believe this mass will resolve on its own. We then discussed the benefits of surgical excision which include complete removal of the lesion. We discussed the risks of excision which include seroma, hematoma, infection, bleeding, damage to surrounding healthy tissue, damage to surrounding structures and need for further surgery. We also discussed the risks of hair loss, wound separation, facial asymmetry and  recurrence of the mass. We discussed scar patterns of tissue rearrangement, if needed.  I explained that the mass will be sent to pathology and if it were to be malignant further surgery may be needed. We discussed the risks of anesthesia. The patient has a good understanding of all the risks and benefits, postoperative course and care. All questions were answered.  Recommend excision in the treatment room. Risks, benefits and limitations were discussed. Patient would like to wait and discuss with dermatology in January if it needs to be removed or  can be monitored. Follow up with me in February.   Carlyon Nolasco M. Juanita Devincent, MD [facility]

## 2023-11-29 ENCOUNTER — Ambulatory Visit
Admission: EM | Admit: 2023-11-29 | Discharge: 2023-11-29 | Disposition: A | Discharge status: Home / Self Care | Attending: Family Medicine | Admitting: Family Medicine

## 2023-11-29 DIAGNOSIS — R030 Elevated blood-pressure reading, without diagnosis of hypertension: Secondary | ICD-10-CM

## 2023-11-29 DIAGNOSIS — J208 Acute bronchitis due to other specified organisms: Secondary | ICD-10-CM

## 2023-11-29 MED ORDER — ALBUTEROL SULFATE HFA 108 (90 BASE) MCG/ACT IN AERS: 2.0000 | INHALATION_SPRAY | RESPIRATORY_TRACT | 0 refills | Status: AC | PRN

## 2023-11-29 MED ORDER — FLUTICASONE PROPIONATE HFA 110 MCG/ACT IN AERO: 2.0000 | INHALATION_SPRAY | Freq: Two times a day (BID) | RESPIRATORY_TRACT | 0 refills | Status: AC

## 2023-11-29 MED ORDER — PROMETHAZINE-DM 6.25-15 MG/5ML PO SYRP: 5.0000 mL | ORAL_SOLUTION | Freq: Four times a day (QID) | ORAL | 0 refills | Status: AC | PRN

## 2023-11-29 NOTE — ED Triage Notes (Addendum)
 Pt reports cough, sore throat, congestion, hoarseness x 9 days has now developed a pain in his chest. Loss of sleep do to the cough pt has been using OTC meds, but stopped due to increased BP

## 2023-11-29 NOTE — Discharge Instructions (Signed)
 In addition to the prescribed medications, you may continue Flonase twice daily, use Coricidin HBP, saline sinus rinses, humidifiers, vapor rubs.  Follow-up for worsening or unresolving symptoms

## 2023-11-29 NOTE — ED Provider Notes (Signed)
 [facility]    CSN: [id number] Arrival date & time: 11/29/23  9087      History   Chief Complaint No chief complaint on file.   HPI Casey Boyer is a 48 y.o. male.   Presenting today with 9-day history of cough, sore throat, congestion, hoarseness.  States other symptoms seem to be improving but the hacking dry itchy cough persists.  Denies fever, chills, chest pain, shortness of breath, vomiting, diarrhea.  So far trying over-the-counter medications but stopped them due to elevation of blood pressure.  No known history of chronic pulmonary disease.    Past Medical History:  Diagnosis Date   Dysplastic nevus 11/20/2023   right abdomen (side) - upper - severe atypia- Ex needed    Hypertension     Patient Active Problem List   Diagnosis Date Noted   Dysplastic nevus 11/20/2023    Past Surgical History:  Procedure Laterality Date   APPENDECTOMY         Home Medications    Prior to Admission medications   Medication Sig Start Date End Date Taking? Authorizing Provider  albuterol (VENTOLIN HFA) 108 (90 Base) MCG/ACT inhaler Inhale 2 puffs into the lungs every 4 (four) hours as needed. 11/29/23  Yes Stuart Vernell Norris, PA-C  fluticasone (FLOVENT HFA) 110 MCG/ACT inhaler Inhale 2 puffs into the lungs 2 (two) times daily. Rinse mouth with water after each use 11/29/23  Yes Stuart Vernell Norris, PA-C  promethazine-dextromethorphan (PROMETHAZINE-DM) 6.25-15 MG/5ML syrup Take 5 mLs by mouth 4 (four) times daily as needed. 11/29/23  Yes Stuart Vernell Norris, PA-C  fluticasone (FLONASE) 50 MCG/ACT nasal spray Place 2 sprays into both nostrils daily. 12/08/22   Leath-Warren, Etta PARAS, NP  ibuprofen (ADVIL,MOTRIN) 200 MG tablet Take 400 mg by mouth every 6 (six) hours as needed.    [provider]  lisinopril (ZESTRIL) 10 MG tablet Take 10 mg by mouth daily. Patient not taking: Reported on 11/27/2023    [provider]  omeprazole  (PRILOSEC) 20 MG capsule Take 20 mg by mouth daily.    [provider]  spironolactone (ALDACTONE) 25 MG tablet Take 25 mg by mouth daily.    [provider]  telmisartan (MICARDIS) 80 MG tablet Take 80 mg by mouth daily.    [provider]    Family History Family History  Problem Relation Age of Onset   Cancer Mother     Social History Social History   Tobacco Use   Smoking status: Never   Smokeless tobacco: Never  Vaping Use   Vaping status: Never Used  Substance Use Topics   Alcohol use: Yes    Comment: occasional   Drug use: Never     Allergies   Guaifenesin   Review of Systems Review of Systems Per HPI  Physical Exam Triage Vital Signs ED Triage Vitals  Encounter Vitals Group     BP 11/29/23 1021 (!) 165/98     Girls Systolic BP Percentile --      Girls Diastolic BP Percentile --      Boys Systolic BP Percentile --      Boys Diastolic BP Percentile --      Pulse Rate 11/29/23 1021 63     Resp 11/29/23 1021 20     Temp 11/29/23 1021 98.1 F (36.7 C)     Temp Source 11/29/23 1021 Oral     SpO2 11/29/23 1021 95 %     Weight --  Height --      Head Circumference --      Peak Flow --      Pain Score 11/29/23 1025 4     Pain Loc --      Pain Education --      Exclude from Growth Chart --    No data found.  Updated Vital Signs BP (!) 165/98 (BP Location: Right Arm)   Pulse 63   Temp 98.1 F (36.7 C) (Oral)   Resp 20   SpO2 95%   Visual Acuity Right Eye Distance:   Left Eye Distance:   Bilateral Distance:    Right Eye Near:   Left Eye Near:    Bilateral Near:     Physical Exam Vitals and nursing note reviewed.  Constitutional:      Appearance: He is well-developed.  HENT:     Head: Atraumatic.     Right Ear: External ear normal.     Left Ear: External ear normal.     Nose: Rhinorrhea present.     Mouth/Throat:     Pharynx: No oropharyngeal exudate.  Eyes:     Conjunctiva/sclera: Conjunctivae normal.      Pupils: Pupils are equal, round, and reactive to light.  Cardiovascular:     Rate and Rhythm: Normal rate and regular rhythm.     Heart sounds: Normal heart sounds.  Pulmonary:     Effort: Pulmonary effort is normal. No respiratory distress.     Breath sounds: Wheezing present. No rales.     Comments: Trace wheezes bilateral bases Musculoskeletal:        General: Normal range of motion.     Cervical back: Normal range of motion and neck supple.  Lymphadenopathy:     Cervical: No cervical adenopathy.  Skin:    General: Skin is warm and dry.  Neurological:     Mental Status: He is alert and oriented to person, place, and time.  Psychiatric:        Behavior: Behavior normal.      UC Treatments / Results  Labs (all labs ordered are listed, but only abnormal results are displayed) Labs Reviewed - No data to display  EKG   Radiology No results found.  Procedures Procedures (including critical care time)  Medications Ordered in UC Medications - No data to display  Initial Impression / Assessment and Plan / UC Course  I have reviewed the triage vital signs and the nursing notes.  Pertinent labs & imaging results that were available during my care of the patient were reviewed by me and considered in my medical decision making (see chart for details).     Suspect viral bronchitis.  Blood pressure elevated today likely secondary to over-the-counter cold and congestion medication.  List given of safe medications to take over-the-counter that will not elevate blood pressure and will treat with Flovent inhaler, albuterol, Phenergan DM.  Discussed supportive over-the-counter medications, home care and return precautions. Final Clinical Impressions(s) / UC Diagnoses   Final diagnoses:  Viral bronchitis  Elevated blood pressure reading     Discharge Instructions      In addition to the prescribed medications, you may continue Flonase twice daily, use Coricidin HBP,  saline sinus rinses, humidifiers, vapor rubs.  Follow-up for worsening or unresolving symptoms    ED Prescriptions     Medication Sig Dispense Auth. Provider   fluticasone (FLOVENT HFA) 110 MCG/ACT inhaler Inhale 2 puffs into the lungs 2 (two) times daily. Rinse mouth with  water after each use 1 each Stuart Vernell Norris, PA-C   albuterol (VENTOLIN HFA) 108 (90 Base) MCG/ACT inhaler Inhale 2 puffs into the lungs every 4 (four) hours as needed. 18 g Stuart Vernell Kennerdell, PA-C   promethazine-dextromethorphan (PROMETHAZINE-DM) 6.25-15 MG/5ML syrup Take 5 mLs by mouth 4 (four) times daily as needed. 100 mL Stuart Vernell Norris, [place]      PDMP not reviewed this encounter.   Stuart Vernell Norris, [place] 11/29/23 1114

## 2024-01-29 ENCOUNTER — Encounter: Payer: Self-pay | Admitting: Dermatology

## 2024-01-29 ENCOUNTER — Ambulatory Visit (INDEPENDENT_AMBULATORY_CARE_PROVIDER_SITE_OTHER): Payer: Self-pay | Admitting: Dermatology

## 2024-01-29 VITALS — BP 131/88 | HR 60

## 2024-01-29 DIAGNOSIS — D225 Melanocytic nevi of trunk: Secondary | ICD-10-CM | POA: Diagnosis not present

## 2024-01-29 DIAGNOSIS — D239 Other benign neoplasm of skin, unspecified: Secondary | ICD-10-CM

## 2024-01-29 NOTE — Progress Notes (Unsigned)
" ° °  Follow-Up Visit   Subjective  Casey Boyer is a 49 y.o. male who presents for the following: Excision of right abdomen  The following portions of the chart were reviewed this encounter and updated as appropriate: medications, allergies, medical history  Review of Systems:  No other skin or systemic complaints except as noted in HPI or Assessment and Plan.  Objective  Well appearing patient in no apparent distress; mood and affect are within normal limits.  A focused examination was performed of the following areas: Right abdomen Relevant physical exam findings are noted in the Assessment and Plan.   Right Abdomen (side) - Upper   Assessment & Plan   DYSPLASTIC NEVUS Right Abdomen (side) - Upper - Skin excision - Right Abdomen (side) - Upper  Excision method:  elliptical Lesion length (cm):  1.4 Lesion width (cm):  0.9 Margin per side (cm):  0.5 Total excision diameter (cm):  2.4 Informed consent: discussed and consent obtained   Timeout: patient name, date of birth, surgical site, and procedure verified   Procedure prep:  Patient was prepped and draped in usual sterile fashion Prep type:  Chlorhexidine Instrument used: #15 blade   Hemostasis achieved with: suture   Hemostasis achieved with comment:  3.0 PDS with dermabond and steri strips Additional details:  Final length 5.2  Specimen 1 - Surgical pathology Differential Diagnosis: DN [id number] Check Margins: No   No follow-ups on file.  I, Darice Smock, CMA, am acting as scribe for RUFUS CHRISTELLA HOLY, MD.   Documentation: I have reviewed the above documentation for accuracy and completeness, and I agree with the above.  RUFUS CHRISTELLA HOLY, MD  "

## 2024-01-29 NOTE — Patient Instructions (Signed)

## 2024-01-30 LAB — SURGICAL PATHOLOGY

## 2024-02-02 ENCOUNTER — Ambulatory Visit: Payer: Self-pay | Admitting: Dermatology

## 2024-02-27 ENCOUNTER — Ambulatory Visit

## 2024-11-17 ENCOUNTER — Ambulatory Visit: Admitting: Dermatology
